# Patient Record
Sex: Female | Born: 1985 | Race: White | Hispanic: No | Marital: Single | Smoking: Current every day smoker
Health system: Southern US, Community
[De-identification: ages and names within clinical notes are randomized; demographics above are authoritative.]

## PROBLEM LIST (undated history)

## (undated) DIAGNOSIS — F32A Depression, unspecified: Secondary | ICD-10-CM

## (undated) DIAGNOSIS — G8929 Other chronic pain: Secondary | ICD-10-CM

## (undated) DIAGNOSIS — M549 Dorsalgia, unspecified: Secondary | ICD-10-CM

## (undated) DIAGNOSIS — F329 Major depressive disorder, single episode, unspecified: Secondary | ICD-10-CM

## (undated) DIAGNOSIS — M1990 Unspecified osteoarthritis, unspecified site: Secondary | ICD-10-CM

## (undated) DIAGNOSIS — F419 Anxiety disorder, unspecified: Secondary | ICD-10-CM

---

## 2009-09-22 ENCOUNTER — Emergency Department (HOSPITAL_BASED_OUTPATIENT_CLINIC_OR_DEPARTMENT_OTHER)
Admission: EM | Admit: 2009-09-22 | Discharge: 2009-09-22 | Payer: Self-pay | Source: Home / Self Care | Admitting: Emergency Medicine

## 2009-09-22 ENCOUNTER — Ambulatory Visit: Payer: Self-pay | Admitting: Diagnostic Radiology

## 2010-04-22 ENCOUNTER — Emergency Department (INDEPENDENT_AMBULATORY_CARE_PROVIDER_SITE_OTHER): Payer: Medicaid Other

## 2010-04-22 ENCOUNTER — Emergency Department (HOSPITAL_BASED_OUTPATIENT_CLINIC_OR_DEPARTMENT_OTHER)
Admission: EM | Admit: 2010-04-22 | Discharge: 2010-04-22 | Disposition: A | Discharge status: Home / Self Care | Payer: Medicaid Other | Attending: Emergency Medicine | Admitting: Emergency Medicine

## 2010-04-22 DIAGNOSIS — R10811 Right upper quadrant abdominal tenderness: Secondary | ICD-10-CM | POA: Insufficient documentation

## 2010-04-22 DIAGNOSIS — R1011 Right upper quadrant pain: Secondary | ICD-10-CM

## 2010-04-22 DIAGNOSIS — S0510XA Contusion of eyeball and orbital tissues, unspecified eye, initial encounter: Secondary | ICD-10-CM | POA: Insufficient documentation

## 2010-04-22 DIAGNOSIS — H5710 Ocular pain, unspecified eye: Secondary | ICD-10-CM | POA: Insufficient documentation

## 2010-04-22 DIAGNOSIS — H5789 Other specified disorders of eye and adnexa: Secondary | ICD-10-CM | POA: Insufficient documentation

## 2010-04-22 DIAGNOSIS — R51 Headache: Secondary | ICD-10-CM

## 2010-04-22 DIAGNOSIS — S0990XA Unspecified injury of head, initial encounter: Secondary | ICD-10-CM | POA: Insufficient documentation

## 2010-04-22 DIAGNOSIS — R079 Chest pain, unspecified: Secondary | ICD-10-CM | POA: Insufficient documentation

## 2010-04-22 DIAGNOSIS — Z79899 Other long term (current) drug therapy: Secondary | ICD-10-CM | POA: Insufficient documentation

## 2010-04-22 DIAGNOSIS — R42 Dizziness and giddiness: Secondary | ICD-10-CM | POA: Insufficient documentation

## 2010-04-22 DIAGNOSIS — Y929 Unspecified place or not applicable: Secondary | ICD-10-CM | POA: Insufficient documentation

## 2010-04-22 LAB — URINALYSIS, ROUTINE W REFLEX MICROSCOPIC
Bilirubin Urine: NEGATIVE
Ketones, ur: NEGATIVE mg/dL
Protein, ur: NEGATIVE mg/dL
Specific Gravity, Urine: 1.021 (ref 1.005–1.030)

## 2010-04-22 LAB — PREGNANCY, URINE: Preg Test, Ur: NEGATIVE

## 2010-04-22 MED ORDER — IOHEXOL 300 MG/ML  SOLN
100.0000 mL | Freq: Once | INTRAMUSCULAR | Status: AC | PRN
  Administered 2010-04-22: 100 mL via INTRAVENOUS

## 2010-05-05 LAB — BASIC METABOLIC PANEL
BUN: 8 mg/dL (ref 6–23)
Creatinine, Ser: 0.6 mg/dL (ref 0.4–1.2)
GFR calc Af Amer: >60 mL/min (ref >60)
GFR calc non Af Amer: >60 mL/min (ref >60)
Glucose, Bld: 106 mg/dL — ABNORMAL HIGH (ref 70–99)

## 2010-05-05 LAB — CBC
HCT: 31.6 % — ABNORMAL LOW (ref 36.0–46.0)
Hemoglobin: 11.4 g/dL — ABNORMAL LOW (ref 12.0–15.0)
Platelets: 122 10*3/uL — ABNORMAL LOW (ref 150–400)
RBC: 3.31 MIL/uL — ABNORMAL LOW (ref 3.87–5.11)

## 2010-05-05 LAB — DIFFERENTIAL
Basophils Absolute: 0 10*3/uL (ref 0.0–0.1)
Monocytes Relative: 5 % (ref 3–12)

## 2010-05-19 ENCOUNTER — Emergency Department (HOSPITAL_BASED_OUTPATIENT_CLINIC_OR_DEPARTMENT_OTHER)
Admission: EM | Admit: 2010-05-19 | Discharge: 2010-05-20 | Disposition: A | Discharge status: Home / Self Care | Payer: Medicaid Other | Attending: Emergency Medicine | Admitting: Emergency Medicine

## 2010-05-19 DIAGNOSIS — M545 Low back pain, unspecified: Secondary | ICD-10-CM | POA: Insufficient documentation

## 2010-05-19 DIAGNOSIS — G8929 Other chronic pain: Secondary | ICD-10-CM | POA: Insufficient documentation

## 2010-05-19 DIAGNOSIS — O2690 Pregnancy related conditions, unspecified, unspecified trimester: Secondary | ICD-10-CM | POA: Insufficient documentation

## 2010-05-19 LAB — URINALYSIS, ROUTINE W REFLEX MICROSCOPIC
Glucose, UA: NEGATIVE mg/dL
Hgb urine dipstick: NEGATIVE
Ketones, ur: NEGATIVE mg/dL
Nitrite: NEGATIVE
Protein, ur: NEGATIVE mg/dL
Specific Gravity, Urine: 1.01 (ref 1.005–1.030)
Urobilinogen, UA: 0.2 mg/dL (ref 0.0–1.0)
pH: 7 (ref 5.0–8.0)

## 2010-06-09 ENCOUNTER — Emergency Department (HOSPITAL_BASED_OUTPATIENT_CLINIC_OR_DEPARTMENT_OTHER)
Admission: EM | Admit: 2010-06-09 | Discharge: 2010-06-09 | Disposition: A | Discharge status: Home / Self Care | Payer: Medicaid Other | Attending: Emergency Medicine | Admitting: Emergency Medicine

## 2010-06-09 DIAGNOSIS — G8929 Other chronic pain: Secondary | ICD-10-CM | POA: Insufficient documentation

## 2010-06-09 DIAGNOSIS — A499 Bacterial infection, unspecified: Secondary | ICD-10-CM | POA: Insufficient documentation

## 2010-06-09 DIAGNOSIS — N760 Acute vaginitis: Secondary | ICD-10-CM | POA: Insufficient documentation

## 2010-06-09 DIAGNOSIS — B9689 Other specified bacterial agents as the cause of diseases classified elsewhere: Secondary | ICD-10-CM | POA: Insufficient documentation

## 2010-06-09 DIAGNOSIS — O200 Threatened abortion: Secondary | ICD-10-CM | POA: Insufficient documentation

## 2010-06-09 LAB — WET PREP, GENITAL
Trich, Wet Prep: NONE SEEN
Yeast Wet Prep HPF POC: NONE SEEN

## 2010-06-09 LAB — HCG, QUANTITATIVE, PREGNANCY: hCG, Beta Chain, Quant, S: 33773 m[IU]/mL — ABNORMAL HIGH (ref <5)

## 2010-06-09 LAB — CBC
MCH: 30.3 pg (ref 26.0–34.0)
Platelets: 154 10*3/uL (ref 150–400)
RBC: 3.89 MIL/uL (ref 3.87–5.11)
WBC: 4.9 10*3/uL (ref 4.0–10.5)

## 2010-06-09 LAB — DIFFERENTIAL
Basophils Absolute: 0 10*3/uL (ref 0.0–0.1)
Basophils Relative: 0 % (ref 0–1)
Eosinophils Absolute: 0 10*3/uL (ref 0.0–0.7)
Eosinophils Relative: 1 % (ref 0–5)
Lymphs Abs: 1.9 10*3/uL (ref 0.7–4.0)
Neutro Abs: 2.6 10*3/uL (ref 1.7–7.7)
Neutrophils Relative %: 52 % (ref 43–77)

## 2010-06-09 LAB — BASIC METABOLIC PANEL
Calcium: 8.9 mg/dL (ref 8.4–10.5)
GFR calc Af Amer: >60 mL/min (ref >60)

## 2010-06-10 LAB — ABO/RH: ABO/RH(D): O NEG

## 2010-06-12 LAB — GC/CHLAMYDIA PROBE AMP, GENITAL: Chlamydia, DNA Probe: NEGATIVE
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
## 2013-12-19 ENCOUNTER — Emergency Department (HOSPITAL_BASED_OUTPATIENT_CLINIC_OR_DEPARTMENT_OTHER)
Admission: EM | Admit: 2013-12-19 | Discharge: 2013-12-19 | Disposition: A | Discharge status: Home / Self Care | Payer: Medicaid Other | Attending: Emergency Medicine | Admitting: Emergency Medicine

## 2013-12-19 ENCOUNTER — Encounter (HOSPITAL_BASED_OUTPATIENT_CLINIC_OR_DEPARTMENT_OTHER): Payer: Self-pay | Admitting: Emergency Medicine

## 2013-12-19 DIAGNOSIS — F329 Major depressive disorder, single episode, unspecified: Secondary | ICD-10-CM | POA: Diagnosis not present

## 2013-12-19 DIAGNOSIS — Z202 Contact with and (suspected) exposure to infections with a predominantly sexual mode of transmission: Secondary | ICD-10-CM | POA: Diagnosis present

## 2013-12-19 DIAGNOSIS — Z720 Tobacco use: Secondary | ICD-10-CM | POA: Diagnosis not present

## 2013-12-19 DIAGNOSIS — A749 Chlamydial infection, unspecified: Secondary | ICD-10-CM | POA: Diagnosis not present

## 2013-12-19 DIAGNOSIS — Z9889 Other specified postprocedural states: Secondary | ICD-10-CM | POA: Diagnosis not present

## 2013-12-19 DIAGNOSIS — Z79899 Other long term (current) drug therapy: Secondary | ICD-10-CM | POA: Diagnosis not present

## 2013-12-19 DIAGNOSIS — B9689 Other specified bacterial agents as the cause of diseases classified elsewhere: Secondary | ICD-10-CM

## 2013-12-19 DIAGNOSIS — Z3202 Encounter for pregnancy test, result negative: Secondary | ICD-10-CM | POA: Insufficient documentation

## 2013-12-19 DIAGNOSIS — G8929 Other chronic pain: Secondary | ICD-10-CM | POA: Diagnosis not present

## 2013-12-19 DIAGNOSIS — N760 Acute vaginitis: Secondary | ICD-10-CM | POA: Insufficient documentation

## 2013-12-19 DIAGNOSIS — M1990 Unspecified osteoarthritis, unspecified site: Secondary | ICD-10-CM | POA: Diagnosis not present

## 2013-12-19 DIAGNOSIS — F419 Anxiety disorder, unspecified: Secondary | ICD-10-CM | POA: Diagnosis not present

## 2013-12-19 HISTORY — DX: Unspecified osteoarthritis, unspecified site: M19.90

## 2013-12-19 HISTORY — DX: Anxiety disorder, unspecified: F41.9

## 2013-12-19 HISTORY — DX: Depression, unspecified: F32.A

## 2013-12-19 HISTORY — DX: Major depressive disorder, single episode, unspecified: F32.9

## 2013-12-19 HISTORY — DX: Other chronic pain: G89.29

## 2013-12-19 HISTORY — DX: Dorsalgia, unspecified: M54.9

## 2013-12-19 LAB — URINALYSIS, ROUTINE W REFLEX MICROSCOPIC
Bilirubin Urine: NEGATIVE
Glucose, UA: NEGATIVE mg/dL
Hgb urine dipstick: NEGATIVE
KETONES UR: NEGATIVE mg/dL
Leukocytes, UA: NEGATIVE
NITRITE: NEGATIVE
Protein, ur: NEGATIVE mg/dL
SPECIFIC GRAVITY, URINE: 1.02 (ref 1.005–1.030)
UROBILINOGEN UA: 0.2 mg/dL (ref 0.0–1.0)
pH: 6.5 (ref 5.0–8.0)

## 2013-12-19 LAB — WET PREP, GENITAL
Trich, Wet Prep: NONE SEEN
Yeast Wet Prep HPF POC: NONE SEEN

## 2013-12-19 LAB — PREGNANCY, URINE: Preg Test, Ur: NEGATIVE

## 2013-12-19 MED ORDER — AZITHROMYCIN 1 G PO PACK
1.0000 g | PACK | Freq: Once | ORAL | Status: AC
  Administered 2013-12-19: 1 g via ORAL
  Filled 2013-12-19: qty 1

## 2013-12-19 MED ORDER — METRONIDAZOLE 500 MG PO TABS: 500.0000 mg | ORAL_TABLET | Freq: Two times a day (BID) | ORAL | Status: AC

## 2013-12-19 NOTE — ED Notes (Signed)
Pt c/o vaginal discharge x 3 wks and sts her partner was diagnosed with chlamydia at health dept but was not treated.

## 2013-12-19 NOTE — ED Provider Notes (Signed)
CSN: [id number]     Arrival date & time 12/19/13  0030 History   First MD Initiated Contact with Patient 12/19/13 0143     Chief Complaint  Patient presents with  . Exposure to STD     (Consider location/radiation/quality/duration/timing/severity/associated sxs/prior Treatment) Patient is a 28 y.o. female presenting with STD exposure. The history is provided by the patient.  Exposure to STD This is a new problem. Episode onset: unknown. The problem occurs constantly. The problem has not changed since onset.Pertinent negatives include no chest pain, no abdominal pain, no headaches and no shortness of breath. Nothing aggravates the symptoms. Nothing relieves the symptoms. She has tried nothing for the symptoms. The treatment provided no relief.  Partner tested positive for chlamydia only and is here to be treated for same vaginal discharge x 4 weeeks  Past Medical History  Diagnosis Date  . Chronic back pain   . Anxiety   . Depression   . Arthritis    Past Surgical History  Procedure Laterality Date  . Cesarean section     No family history on file. History  Substance Use Topics  . Smoking status: Current Every Day Smoker  . Smokeless tobacco: Not on file  . Alcohol Use: No   OB History   Grav Para Term Preterm Abortions TAB SAB Ect Mult Living                 Review of Systems  Respiratory: Negative for shortness of breath.   Cardiovascular: Negative for chest pain.  Gastrointestinal: Negative for abdominal pain.  Genitourinary: Positive for vaginal discharge. Negative for genital sores.  Neurological: Negative for headaches.  All other systems reviewed and are negative.     Allergies  Review of patient's allergies indicates no known allergies.  Home Medications   Prior to Admission medications   Medication Sig Start Date End Date Taking? Authorizing Provider  ALPRAZolam Prudy Feeler(XANAX) 1 MG tablet Take 1 mg by mouth 3 (three) times daily.   Yes Historical Provider, MD   amphetamine-dextroamphetamine (ADDERALL) 30 MG tablet Take 30 mg by mouth 2 (two) times daily.   Yes Historical Provider, MD  buprenorphine (SUBUTEX) 2 MG SUBL SL tablet Place under the tongue daily.   Yes Historical Provider, MD  buPROPion (WELLBUTRIN XL) 300 MG 24 hr tablet Take 300 mg by mouth daily.   Yes Historical Provider, MD  ibuprofen (ADVIL,MOTRIN) 800 MG tablet Take 800 mg by mouth every 8 (eight) hours as needed.   Yes Historical Provider, MD  omeprazole (PRILOSEC) 20 MG capsule Take 20 mg by mouth daily.   Yes Historical Provider, MD   BP 137/92  Pulse 88  Temp(Src) 99 F (37.2 C) (Oral)  Resp 18  Ht 5\' 8"  (1.727 m)  Wt 145 lb (65.772 kg)  BMI 22.05 kg/m2  SpO2 98% Physical Exam  Constitutional: She is oriented to person, place, and time. She appears well-developed and well-nourished. No distress.  HENT:  Head: Normocephalic and atraumatic.  Mouth/Throat: Oropharynx is clear and moist. No oropharyngeal exudate.  Eyes: Conjunctivae are normal. Pupils are equal, round, and reactive to light.  Neck: Normal range of motion. Neck supple.  Cardiovascular: Normal rate, regular rhythm and intact distal pulses.   Pulmonary/Chest: Effort normal and breath sounds normal. No respiratory distress. She has no wheezes.  Abdominal: Soft. Bowel sounds are normal. There is no tenderness. There is no rebound and no guarding.  Genitourinary: Vaginal discharge found.  No CMT no adnexal tenderness chaperone present  Musculoskeletal: Normal range of motion.  Neurological: She is alert and oriented to person, place, and time.  Skin: Skin is warm and dry.  Psychiatric: She has a normal mood and affect.    ED Course  Procedures (including critical care time) Labs Review Labs Reviewed  GC/CHLAMYDIA PROBE AMP  WET PREP, GENITAL  URINALYSIS, ROUTINE W REFLEX MICROSCOPIC  PREGNANCY, URINE    Imaging Review No results found.   EKG Interpretation None      MDM   Final diagnoses:   None    Will treat for Chlamydia no sexual activity until 7 days after all partners treated.  Follow up in 7 days with health dept.  Patient verbalizes understanding and agrees to follow up    Donaldo Teegarden Smitty CordsK Breeonna Mone-Rasch, MD 12/19/13 [phone]

## 2013-12-19 NOTE — ED Notes (Signed)
Pt ambulating independently w/ steady gait on d/c in no acute distress, A&Ox4. D/c instructions reviewed w/ pt and family - pt and family deny any further questions or concerns at present. Rx given x1  
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
## 2013-12-19 NOTE — ED Notes (Signed)
Pt not in room - socks and phone charger noted however unable to locate pt.

## 2013-12-22 LAB — GC/CHLAMYDIA PROBE AMP
CT Probe RNA: NEGATIVE
GC Probe RNA: NEGATIVE

## 2013-12-29 ENCOUNTER — Telehealth (HOSPITAL_COMMUNITY): Payer: Self-pay
